# Patient Record
Sex: Female | Born: 1995 | Race: White | Hispanic: No | Marital: Single | Smoking: Current every day smoker
Health system: Southern US, Community
[De-identification: ages and names within clinical notes are randomized; demographics above are authoritative.]

## PROBLEM LIST (undated history)

## (undated) DIAGNOSIS — A0472 Enterocolitis due to Clostridium difficile, not specified as recurrent: Secondary | ICD-10-CM

## (undated) DIAGNOSIS — G0481 Other encephalitis and encephalomyelitis: Secondary | ICD-10-CM

## (undated) DIAGNOSIS — F909 Attention-deficit hyperactivity disorder, unspecified type: Secondary | ICD-10-CM

---
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
## 2016-06-06 ENCOUNTER — Emergency Department (HOSPITAL_COMMUNITY)
Admission: EM | Admit: 2016-06-06 | Discharge: 2016-06-06 | Disposition: A | Discharge status: Home / Self Care | Payer: Medicaid Other | Attending: Emergency Medicine | Admitting: Emergency Medicine

## 2016-06-06 ENCOUNTER — Encounter (HOSPITAL_COMMUNITY): Payer: Self-pay | Admitting: Emergency Medicine

## 2016-06-06 ENCOUNTER — Emergency Department (HOSPITAL_COMMUNITY): Payer: Medicaid Other

## 2016-06-06 DIAGNOSIS — R300 Dysuria: Secondary | ICD-10-CM | POA: Insufficient documentation

## 2016-06-06 DIAGNOSIS — F17200 Nicotine dependence, unspecified, uncomplicated: Secondary | ICD-10-CM | POA: Insufficient documentation

## 2016-06-06 DIAGNOSIS — F909 Attention-deficit hyperactivity disorder, unspecified type: Secondary | ICD-10-CM | POA: Insufficient documentation

## 2016-06-06 DIAGNOSIS — K529 Noninfective gastroenteritis and colitis, unspecified: Secondary | ICD-10-CM | POA: Diagnosis not present

## 2016-06-06 DIAGNOSIS — R1084 Generalized abdominal pain: Secondary | ICD-10-CM | POA: Diagnosis present

## 2016-06-06 DIAGNOSIS — Z79899 Other long term (current) drug therapy: Secondary | ICD-10-CM | POA: Diagnosis not present

## 2016-06-06 HISTORY — DX: Enterocolitis due to Clostridium difficile, not specified as recurrent: A04.72

## 2016-06-06 HISTORY — DX: Attention-deficit hyperactivity disorder, unspecified type: F90.9

## 2016-06-06 LAB — URINE MICROSCOPIC-ADD ON
BACTERIA UA: NONE SEEN
WBC UA: NONE SEEN WBC/hpf (ref 0–5)

## 2016-06-06 LAB — URINALYSIS, ROUTINE W REFLEX MICROSCOPIC
Glucose, UA: NEGATIVE mg/dL
Ketones, ur: NEGATIVE mg/dL
Leukocytes, UA: NEGATIVE
NITRITE: NEGATIVE
PH: 5.5 (ref 5.0–8.0)

## 2016-06-06 LAB — COMPREHENSIVE METABOLIC PANEL
ALT: 12 U/L — AB (ref 14–54)
ANION GAP: 12 (ref 5–15)
AST: 21 U/L (ref 15–41)
Albumin: 4.5 g/dL (ref 3.5–5.0)
Alkaline Phosphatase: 68 U/L (ref 38–126)
BUN: 19 mg/dL (ref 6–20)
CHLORIDE: 103 mmol/L (ref 101–111)
CO2: 22 mmol/L (ref 22–32)
CREATININE: 0.98 mg/dL (ref 0.44–1.00)
Calcium: 9.4 mg/dL (ref 8.9–10.3)
Glucose, Bld: 108 mg/dL — ABNORMAL HIGH (ref 65–99)
POTASSIUM: 3.5 mmol/L (ref 3.5–5.1)
SODIUM: 137 mmol/L (ref 135–145)
Total Bilirubin: 1.2 mg/dL (ref 0.3–1.2)
Total Protein: 8.6 g/dL — ABNORMAL HIGH (ref 6.5–8.1)

## 2016-06-06 LAB — CBC WITH DIFFERENTIAL/PLATELET
Basophils Absolute: 0 10*3/uL (ref 0.0–0.1)
Basophils Relative: 0 %
EOS ABS: 0 10*3/uL (ref 0.0–0.7)
Eosinophils Relative: 0 %
HEMATOCRIT: 47.9 % — AB (ref 36.0–46.0)
HEMOGLOBIN: 16.2 g/dL — AB (ref 12.0–15.0)
LYMPHS ABS: 0.7 10*3/uL (ref 0.7–4.0)
Lymphocytes Relative: 5 %
MCH: 29 pg (ref 26.0–34.0)
MCHC: 33.8 g/dL (ref 30.0–36.0)
MCV: 85.8 fL (ref 78.0–100.0)
MONOS PCT: 6 %
Monocytes Absolute: 0.9 10*3/uL (ref 0.1–1.0)
NEUTROS ABS: 12 10*3/uL — AB (ref 1.7–7.7)
NEUTROS PCT: 89 %
Platelets: 282 10*3/uL (ref 150–400)
RBC: 5.58 MIL/uL — AB (ref 3.87–5.11)
RDW: 13.2 % (ref 11.5–15.5)
WBC: 13.6 10*3/uL — AB (ref 4.0–10.5)

## 2016-06-06 LAB — C DIFFICILE QUICK SCREEN W PCR REFLEX
C DIFFICILE (CDIFF) TOXIN: NEGATIVE
C DIFFICLE (CDIFF) ANTIGEN: NEGATIVE
C Diff interpretation: NOT DETECTED

## 2016-06-06 MED ORDER — ONDANSETRON HCL 4 MG/2ML IJ SOLN
4.0000 mg | Freq: Once | INTRAMUSCULAR | Status: AC
  Administered 2016-06-06: 4 mg via INTRAVENOUS
  Filled 2016-06-06: qty 2

## 2016-06-06 MED ORDER — SODIUM CHLORIDE 0.9 % IV SOLN
1000.0000 mL | INTRAVENOUS | Status: DC
  Administered 2016-06-06: 1000 mL via INTRAVENOUS

## 2016-06-06 MED ORDER — MORPHINE SULFATE (PF) 4 MG/ML IV SOLN
4.0000 mg | Freq: Once | INTRAVENOUS | Status: AC
  Administered 2016-06-06: 4 mg via INTRAVENOUS
  Filled 2016-06-06: qty 1

## 2016-06-06 MED ORDER — SODIUM CHLORIDE 0.9 % IV SOLN
1000.0000 mL | Freq: Once | INTRAVENOUS | Status: AC
  Administered 2016-06-06: 1000 mL via INTRAVENOUS

## 2016-06-06 MED ORDER — TRAMADOL HCL 50 MG PO TABS: ORAL_TABLET | ORAL | 0 refills | Status: AC

## 2016-06-06 MED ORDER — PROCHLORPERAZINE EDISYLATE 5 MG/ML IJ SOLN
10.0000 mg | Freq: Once | INTRAMUSCULAR | Status: AC
  Administered 2016-06-06: 10 mg via INTRAVENOUS
  Filled 2016-06-06: qty 2

## 2016-06-06 MED ORDER — IOPAMIDOL (ISOVUE-300) INJECTION 61%
INTRAVENOUS | Status: AC
  Filled 2016-06-06: qty 30

## 2016-06-06 MED ORDER — SODIUM CHLORIDE 0.9 % IV SOLN: 1000.0000 mL | INTRAVENOUS | Status: DC

## 2016-06-06 MED ORDER — SODIUM CHLORIDE 0.9 % IV SOLN
Freq: Once | INTRAVENOUS | Status: AC
  Administered 2016-06-06: 14:00:00 via INTRAVENOUS

## 2016-06-06 MED ORDER — PROMETHAZINE HCL 25 MG PO TABS: 25.0000 mg | ORAL_TABLET | Freq: Four times a day (QID) | ORAL | 0 refills | Status: AC | PRN

## 2016-06-06 NOTE — ED Triage Notes (Signed)
Pt c/o n/v/d x 1 day. Recent abx and hx of c diff per pt. Mm wet.mild gen weakness noted.

## 2016-06-06 NOTE — ED Notes (Signed)
Ice chips given per H. Beverely PaceBryant PA

## 2016-06-06 NOTE — ED Provider Notes (Signed)
[facility]Y DEPT Provider Note   CSN: [id number] Arrival date & time: 06/06/16  0827     History   Chief Complaint Chief Complaint  Patient presents with  . Abdominal Pain    HPI Tami Maddox is a 20 y.o. female.  Blood in stool   The history is provided by the patient.  Abdominal Pain   This is a new problem. The current episode started yesterday. The problem occurs hourly. The problem has been gradually worsening. The pain is associated with an unknown factor. The pain is located in the generalized abdominal region. The pain is at a severity of 7/10. The pain is moderate. Associated symptoms include diarrhea, hematochezia, nausea, vomiting, dysuria and myalgias. Pertinent negatives include fever and flatus. Nothing aggravates the symptoms. Nothing relieves the symptoms. Past workup includes GI consult. Her past medical history does not include PUD. 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    Past Medical History:  Diagnosis Date  . ADHD   . Autoimmune encephalomyelitis   . C. difficile colitis     There are no active problems to display for this patient.   History reviewed. No pertinent surgical history.  OB History    No data available       Home Medications    Prior to Admission medications   Medication Sig Start Date End Date Taking? Authorizing Provider  albuterol (PROVENTIL HFA;VENTOLIN HFA) 108 (90 Base) MCG/ACT inhaler Inhale 2 puffs into the lungs every 4 (four) hours as needed for wheezing or shortness of breath. 12/23/15  Yes Kristen N Ward, DO  atomoxetine (STRATTERA) 18 MG capsule Take 1 capsule by mouth daily. 05/18/16 06/17/16 Yes Historical Provider, MD  etonogestrel (NEXPLANON) 68 MG IMPL implant 1 each by Subdermal route once. Reported on 10/15/2015   Yes Historical Provider, MD  mycophenolate (CELLCEPT) 500 MG tablet Take 1,500 mg by mouth 2 (two) times daily. 04/24/14  Yes Historical Provider,  MD  sertraline (ZOLOFT) 100 MG tablet Take 200 mg by mouth daily.  04/24/14  Yes Historical Provider, MD    Family History History reviewed. No pertinent family history.  Social History Social History  Substance Use Topics  . Smoking status: Current Every Day Smoker  . Smokeless tobacco: Former NeurosurgeonUser    Types: Chew  . Alcohol use No     Allergies   Bactrim [sulfamethoxazole-trimethoprim]   Review of Systems Review of Systems  Constitutional: Negative for fever.  Gastrointestinal: Positive for abdominal pain, diarrhea, hematochezia, nausea and vomiting. Negative for flatus.  Genitourinary: Positive for dysuria.  Musculoskeletal: Positive for myalgias.  All other systems reviewed and are negative.    Physical Exam Updated Vital Signs BP 111/74 (BP Location: Left Arm)   Pulse 112   Temp 97.7 F (36.5 C) (Oral)   Resp 18   Ht 5\' 6"  (1.676 m)   Wt 68 kg   SpO2 96%   BMI 24.21 kg/m   Physical Exam  Constitutional: Vital signs are normal. She appears well-developed and well-nourished. She is active.  HENT:  Head: Normocephalic and atraumatic.  Right Ear: Tympanic membrane, external ear and ear canal normal.  Left Ear: Tympanic membrane, external ear and ear canal normal.  Nose: Nose normal.  Mouth/Throat: Uvula is midline, oropharynx is clear and moist and mucous membranes are normal.  Eyes: Conjunctivae, EOM and lids are normal. Pupils are equal, round, and reactive to light.  Neck: Trachea normal, normal range of motion and phonation normal. Neck  supple. Carotid bruit is not present.  Cardiovascular: Normal rate, regular rhythm and normal pulses.   Abdominal: Soft. Normal appearance and bowel sounds are normal. She exhibits no distension and no mass. There is tenderness.  Diffuse tenderness and soreness. No CVA tenderness. No hepatomegaly. No spleenomegaly  Lymphadenopathy:       Head (right side): No submental, no preauricular and no posterior auricular adenopathy  present.       Head (left side): No submental, no preauricular and no posterior auricular adenopathy present.    She has no cervical adenopathy.  Neurological: She is alert. She has normal strength. No cranial nerve deficit or sensory deficit. GCS eye subscore is 4. GCS verbal subscore is 5. GCS motor subscore is 6.  Skin: Skin is warm and dry.  Psychiatric: Her speech is normal.     ED Treatments / Results  Labs (all labs ordered are listed, but only abnormal results are displayed) Labs Reviewed  COMPREHENSIVE METABOLIC PANEL  CBC WITH DIFFERENTIAL/PLATELET  URINALYSIS, ROUTINE W REFLEX MICROSCOPIC (NOT AT [facility])  POC URINE PREG, ED    EKG  EKG Interpretation None       Radiology No results found.  Procedures Procedures (including critical care time)  Medications Ordered in ED Medications - No data to display   Initial Impression / Assessment and Plan / ED Course  I have reviewed the triage vital signs and the nursing notes.  Pertinent labs & imaging results that were available during my care of the patient were reviewed by me and considered in my medical decision making (see chart for details).  Clinical Course    *I have reviewed nursing notes, vital signs, and all appropriate lab and imaging results for this patient.**  Final Clinical Impressions(s) / ED Diagnoses  Heart rate is 108, VS o/w wnl. No active vomiting in ED.  Recheck: After IV zofran, vomiting improving. Pt c/o continued pain. Cmet non-acute. WBC elevated at 13.6. UA reveals an elevated specific gravity, small hgb and trace protein. O/w wnl.   Pt feeling much better after iv fluids, and anti-emetics. Pt eating crackers, and drinking without problem. She request not to have CT scan at this time. Pt referred to GI. C Diff test ordered. Rx for promethazine and ultram given to the patient. Pt in agreement with this plan and will f/u with GI.  At d/c, pt's blood pressure was 85/45. Pt asymptomatic.  liteer of IV fluids given to the patient. 4:00pm b/p up to 97 earlier, but 86 systolic at this time. 2nd bolus given. Pt in no distress.  Pt given another liter of IV fluids. Recheck: pt ambulatory in the room. Pt states she feels much better and wants to go home. Pt discharged. Rx for promethazine and ultram given to the patient. Pt referred to GI. C Dif results pending.   Final diagnoses:  None    New Prescriptions Discharge Medication List as of 06/06/2016  1:26 PM    START taking these medications   Details  promethazine (PHENERGAN) 25 MG tablet Take 1 tablet (25 mg total) by mouth every 6 (six) hours as needed for nausea or vomiting., Starting Wed 06/06/2016, Print    traMADol (ULTRAM) 50 MG tablet 1 or 2 po q6h prn pain, Print         Ivery QualeHobson Sophiah Rolin, PA-C 06/06/16 1325    Ivery QualeHobson Shadow Stiggers, PA-C 06/07/16 2048    Benjiman CoreNathan Pickering, MD 06/08/16 2316

## 2016-06-06 NOTE — Discharge Instructions (Addendum)
Your electrolytes are within normal limits. Your urine test is negative. Your urine pregnancy test is negative. A test for C. difficile has been sent to the lab. Please use clear liquids for the next 24 hours. Use promethazine for nausea or vomiting. Use Ultram for pain not improved by Tylenol extra strength. Both promethazine and Ultram may cause drowsiness. Please do not drive, drink alcohol, operate machinery, or participate in activities requiring concentration when taking this medication. Please call Dr.Rehman for GI evaluation of your abdominal pain and for follow-up of your C. difficile testing.

## 2016-06-06 NOTE — ED Notes (Signed)
Pt tolerated meal well.

## 2016-06-06 NOTE — ED Notes (Signed)
bp 89/47, HR 100.  PA notified and a second liter bolus administered.

## 2016-06-07 LAB — POC URINE PREG, ED: Preg Test, Ur: NEGATIVE

## 2016-06-23 ENCOUNTER — Encounter: Payer: Self-pay | Admitting: Emergency Medicine

## 2016-06-23 ENCOUNTER — Emergency Department
Admission: EM | Admit: 2016-06-23 | Discharge: 2016-06-23 | Disposition: A | Discharge status: Home / Self Care | Payer: Medicaid Other | Attending: Emergency Medicine | Admitting: Emergency Medicine

## 2016-06-23 DIAGNOSIS — R109 Unspecified abdominal pain: Secondary | ICD-10-CM | POA: Diagnosis present

## 2016-06-23 DIAGNOSIS — F909 Attention-deficit hyperactivity disorder, unspecified type: Secondary | ICD-10-CM | POA: Diagnosis not present

## 2016-06-23 DIAGNOSIS — F17200 Nicotine dependence, unspecified, uncomplicated: Secondary | ICD-10-CM | POA: Diagnosis not present

## 2016-06-23 DIAGNOSIS — Z79899 Other long term (current) drug therapy: Secondary | ICD-10-CM | POA: Insufficient documentation

## 2016-06-23 LAB — COMPREHENSIVE METABOLIC PANEL
ALBUMIN: 4.2 g/dL (ref 3.5–5.0)
ALK PHOS: 67 U/L (ref 38–126)
ALT: 13 U/L — AB (ref 14–54)
AST: 19 U/L (ref 15–41)
Anion gap: 6 (ref 5–15)
BUN: 8 mg/dL (ref 6–20)
CALCIUM: 9.5 mg/dL (ref 8.9–10.3)
CHLORIDE: 105 mmol/L (ref 101–111)
CO2: 26 mmol/L (ref 22–32)
CREATININE: 0.85 mg/dL (ref 0.44–1.00)
GFR calc Af Amer: >60 mL/min (ref >60)
GFR calc non Af Amer: >60 mL/min (ref >60)
GLUCOSE: 100 mg/dL — AB (ref 65–99)
Potassium: 4.3 mmol/L (ref 3.5–5.1)
SODIUM: 137 mmol/L (ref 135–145)
Total Bilirubin: 1 mg/dL (ref 0.3–1.2)
Total Protein: 8.1 g/dL (ref 6.5–8.1)

## 2016-06-23 LAB — CBC
HCT: 46.9 % (ref 35.0–47.0)
Hemoglobin: 15.7 g/dL (ref 12.0–16.0)
MCH: 28 pg (ref 26.0–34.0)
MCHC: 33.4 g/dL (ref 32.0–36.0)
MCV: 83.9 fL (ref 80.0–100.0)
PLATELETS: 293 10*3/uL (ref 150–440)
RBC: 5.59 MIL/uL — ABNORMAL HIGH (ref 3.80–5.20)
RDW: 13.8 % (ref 11.5–14.5)
WBC: 7.7 10*3/uL (ref 3.6–11.0)

## 2016-06-23 LAB — LIPASE, BLOOD: LIPASE: 24 U/L (ref 11–51)

## 2016-06-23 MED ORDER — RANITIDINE HCL 150 MG PO TABS: 150.0000 mg | ORAL_TABLET | Freq: Two times a day (BID) | ORAL | 1 refills | Status: AC

## 2016-06-23 MED ORDER — SUCRALFATE 1 G PO TABS: 1.0000 g | ORAL_TABLET | Freq: Four times a day (QID) | ORAL | 0 refills | Status: AC

## 2016-06-23 MED ORDER — DICYCLOMINE HCL 20 MG PO TABS: 20.0000 mg | ORAL_TABLET | Freq: Three times a day (TID) | ORAL | 0 refills | Status: AC | PRN

## 2016-06-23 MED ORDER — DICYCLOMINE HCL 10 MG/ML IM SOLN
20.0000 mg | Freq: Once | INTRAMUSCULAR | Status: AC
  Administered 2016-06-23: 20 mg via INTRAMUSCULAR
  Filled 2016-06-23: qty 2

## 2016-06-23 MED ORDER — ONDANSETRON HCL 4 MG/2ML IJ SOLN
4.0000 mg | Freq: Once | INTRAMUSCULAR | Status: AC | PRN
  Administered 2016-06-23: 4 mg via INTRAVENOUS
  Filled 2016-06-23: qty 2

## 2016-06-23 MED ORDER — SODIUM CHLORIDE 0.9 % IV BOLUS (SEPSIS)
1000.0000 mL | Freq: Once | INTRAVENOUS | Status: AC
Start: 2016-06-23 — End: 2016-06-23
  Administered 2016-06-23: 1000 mL via INTRAVENOUS

## 2016-06-23 MED ORDER — OXYCODONE-ACETAMINOPHEN 5-325 MG PO TABS
1.0000 | ORAL_TABLET | ORAL | Status: DC | PRN
  Filled 2016-06-23: qty 1

## 2016-06-23 NOTE — ED Provider Notes (Signed)
[facility] Emergency Department Provider Note    ____________________________________________   I have reviewed the triage vital signs and the nursing notes.   HISTORY  Chief Complaint Abdominal Pain   History limited by: Not Limited   HPI Tami Maddox is a 20 y.o. female who presents to the emergency department today because of concern for continued abdominal pain. The patient was seen at [facility] emergency department a little over two weeks ago for abdominal pain, nausea and diarrhea. At that time she was diagnosed with gastroenteritis. The patient has had continued abdominal pain since that time. She has been to see her PCP in the interim. She states that the nausea and diarrhea have improved, no diarrhea or bowel movement today. She states she has not had much to eat the past two days.   Past Medical History:  Diagnosis Date  . ADHD   . Autoimmune encephalomyelitis   . C. difficile colitis     There are no active problems to display for this patient.   History reviewed. No pertinent surgical history.  Prior to Admission medications   Medication Sig Start Date End Date Taking? Authorizing Provider  albuterol (PROVENTIL HFA;VENTOLIN HFA) 108 (90 Base) MCG/ACT inhaler Inhale 2 puffs into the lungs every 4 (four) hours as needed for wheezing or shortness of breath. 12/23/15   Kristen N Ward, DO  atomoxetine (STRATTERA) 18 MG capsule Take 1 capsule by mouth daily. 05/18/16 06/17/16  Historical Provider, MD  etonogestrel (NEXPLANON) 68 MG IMPL implant 1 each by Subdermal route once. Reported on 10/15/2015    Historical Provider, MD  mycophenolate (CELLCEPT) 500 MG tablet Take 1,500 mg by mouth 2 (two) times daily. 04/24/14   Historical Provider, MD  promethazine (PHENERGAN) 25 MG tablet Take 1 tablet (25 mg total) by mouth every 6 (six) hours as needed for nausea or vomiting. 06/06/16   Ivery QualeHobson Bryant, PA-C  sertraline (ZOLOFT) 100 MG tablet Take 200 mg by  mouth daily.  04/24/14   Historical Provider, MD  traMADol Janean Sark(ULTRAM) 50 MG tablet 1 or 2 po q6h prn pain 06/06/16   Ivery QualeHobson Bryant, PA-C    Allergies Bactrim [sulfamethoxazole-trimethoprim]  History reviewed. No pertinent family history.  Social History Social History  Substance Use Topics  . Smoking status: Current Every Day Smoker  . Smokeless tobacco: Former NeurosurgeonUser    Types: Chew  . Alcohol use No    Review of Systems  Constitutional: Negative for fever. Cardiovascular: Negative for chest pain. Respiratory: Negative for shortness of breath. Gastrointestinal: Positive for abdominal pain. Genitourinary: Negative for dysuria. Musculoskeletal: Negative for back pain. Skin: Negative for rash. Neurological: Negative for headaches, focal weakness or numbness.  10-point ROS otherwise negative.  ____________________________________________   PHYSICAL EXAM:  VITAL SIGNS: ED Triage Vitals  Enc Vitals Group     BP 06/23/16 1120 108/67     Pulse Rate 06/23/16 1120 82     Resp 06/23/16 1120 18     Temp 06/23/16 1120 97.6 F (36.4 C)     Temp Source 06/23/16 1120 Oral     SpO2 06/23/16 1120 99 %     Weight 06/23/16 1121 145 lb (65.8 kg)     Height 06/23/16 1121 5\' 6"  (1.676 m)     Head Circumference --      Peak Flow --      Pain Score 06/23/16 1121 9   Constitutional: Alert and oriented. Well appearing and in no distress. Eyes: Conjunctivae are normal. Normal extraocular  movements. ENT   Head: Normocephalic and atraumatic.   Nose: No congestion/rhinnorhea.   Mouth/Throat: Mucous membranes are moist.   Neck: No stridor. Hematological/Lymphatic/Immunilogical: No cervical lymphadenopathy. Cardiovascular: Normal rate, regular rhythm.  No murmurs, rubs, or gallops.  Respiratory: Normal respiratory effort without tachypnea nor retractions. Breath sounds are clear and equal bilaterally. No wheezes/rales/rhonchi. Gastrointestinal: Soft and nontender. No distention.   Genitourinary: Deferred Musculoskeletal: Normal range of motion in all extremities. No lower extremity edema. Neurologic:  Normal speech and language. No gross focal neurologic deficits are appreciated.  Skin:  Skin is warm, dry and intact. No rash noted. Psychiatric: Mood and affect are normal. Speech and behavior are normal. Patient exhibits appropriate insight and judgment.  ____________________________________________    LABS (pertinent positives/negatives)  Labs Reviewed  COMPREHENSIVE METABOLIC PANEL - Abnormal; Notable for the following:       Result Value   Glucose, Bld 100 (*)    ALT 13 (*)    All other components within normal limits  CBC - Abnormal; Notable for the following:    RBC 5.59 (*)    All other components within normal limits  LIPASE, BLOOD  URINALYSIS COMPLETEWITH MICROSCOPIC ([facility] ONLY)  POC URINE PREG, ED    ____________________________________________   EKG  None  ____________________________________________    RADIOLOGY  None   ____________________________________________   PROCEDURES  Procedures  ____________________________________________   INITIAL IMPRESSION / ASSESSMENT AND PLAN / ED COURSE  Pertinent labs & imaging results that were available during my care of the patient were reviewed by me and considered in my medical decision making (see chart for details).  Patient presented because of concern for abdominal pain. Blood work here unconcerning. No leukocytosis or fever to suggest series intraabdominal infection. Patient was given IVFs and bentyl and did state she felt some improvement. Did offer to try GI cocktail however patient declined. At this point do not feel any emergent imaging is warranted. Will plan on discharging home with GI follow up information. ____________________________________________   FINAL CLINICAL IMPRESSION(S) / ED DIAGNOSES  Final diagnoses:  Abdominal pain, unspecified abdominal location      Note: This dictation was prepared with Dragon dictation. Any transcriptional errors that result from this process are unintentional    Phineas SemenGraydon Dangelo Guzzetta, MD 06/23/16 1529

## 2016-06-23 NOTE — ED Notes (Signed)
Pt alert and oriented X4, active, cooperative, pt in NAD. RR even and unlabored, color WNL.  Pt informed to return if any life threatening symptoms occur.   

## 2016-06-23 NOTE — ED Notes (Signed)
Pt states "I'm still in a lot of pain". Pt appears to be in NAD at this time, no longer moaning and rolling around the bed at this time. VSS. Will continue to monitor for further patient needs.

## 2016-06-23 NOTE — ED Triage Notes (Signed)
Pt presents to ED c/o generalized abd pain x2 weeks. C/o blood and mucus in stool x2 weeks. Hx c-diff, hx abt treatment for autoimmune encephalitis.

## 2016-06-23 NOTE — Discharge Instructions (Signed)
Please seek medical attention for any high fevers, chest pain, shortness of breath, change in behavior, persistent vomiting, bloody stool or any other new or concerning symptoms.
